# Patient Record
Sex: Male | Born: 1980 | Race: White | Hispanic: No | Marital: Married | Smoking: Former smoker
Health system: Southern US, Community
[De-identification: ages and names within clinical notes are randomized; demographics above are authoritative.]

## PROBLEM LIST (undated history)

## (undated) DIAGNOSIS — K219 Gastro-esophageal reflux disease without esophagitis: Secondary | ICD-10-CM

## (undated) DIAGNOSIS — I480 Paroxysmal atrial fibrillation: Secondary | ICD-10-CM

## (undated) DIAGNOSIS — K5900 Constipation, unspecified: Secondary | ICD-10-CM

## (undated) DIAGNOSIS — N200 Calculus of kidney: Secondary | ICD-10-CM

## (undated) DIAGNOSIS — E663 Overweight: Secondary | ICD-10-CM

## (undated) DIAGNOSIS — R5383 Other fatigue: Secondary | ICD-10-CM

## (undated) DIAGNOSIS — R0789 Other chest pain: Secondary | ICD-10-CM

## (undated) HISTORY — DX: Other fatigue: R53.83

## (undated) HISTORY — DX: Gastro-esophageal reflux disease without esophagitis: K21.9

## (undated) HISTORY — DX: Constipation, unspecified: K59.00

## (undated) HISTORY — DX: Overweight: E66.3

## (undated) HISTORY — DX: Calculus of kidney: N20.0

## (undated) HISTORY — DX: Other chest pain: R07.89

## (undated) HISTORY — PX: ELBOW SURGERY: SHX618

---
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
## 2001-04-03 ENCOUNTER — Encounter: Payer: Self-pay | Admitting: Emergency Medicine

## 2001-04-03 ENCOUNTER — Emergency Department (HOSPITAL_COMMUNITY): Admission: EM | Admit: 2001-04-03 | Discharge: 2001-04-03 | Payer: Self-pay

## 2006-01-07 ENCOUNTER — Inpatient Hospital Stay (HOSPITAL_COMMUNITY): Admission: EM | Admit: 2006-01-07 | Discharge: 2006-01-08 | Payer: Self-pay | Admitting: Emergency Medicine

## 2006-01-10 ENCOUNTER — Encounter: Admission: RE | Admit: 2006-01-10 | Discharge: 2006-04-10 | Payer: Self-pay | Admitting: Orthopedic Surgery

## 2010-08-31 ENCOUNTER — Emergency Department (HOSPITAL_COMMUNITY)
Admission: EM | Admit: 2010-08-31 | Discharge: 2010-08-31 | Disposition: A | Discharge status: Home / Self Care | Payer: 59 | Attending: Emergency Medicine | Admitting: Emergency Medicine

## 2010-08-31 ENCOUNTER — Emergency Department (HOSPITAL_COMMUNITY): Payer: 59

## 2010-08-31 ENCOUNTER — Encounter: Payer: Self-pay | Admitting: Cardiovascular Disease

## 2010-08-31 DIAGNOSIS — I4891 Unspecified atrial fibrillation: Secondary | ICD-10-CM

## 2010-08-31 DIAGNOSIS — R000 Tachycardia, unspecified: Secondary | ICD-10-CM | POA: Insufficient documentation

## 2010-08-31 DIAGNOSIS — R002 Palpitations: Secondary | ICD-10-CM | POA: Insufficient documentation

## 2010-08-31 DIAGNOSIS — R42 Dizziness and giddiness: Secondary | ICD-10-CM | POA: Insufficient documentation

## 2010-08-31 LAB — T3, FREE: T3, Free: 3.6 pg/mL (ref 2.3–4.2)

## 2010-08-31 LAB — BASIC METABOLIC PANEL
BUN: 11 mg/dL (ref 6–23)
Calcium: 9.3 mg/dL (ref 8.4–10.5)
Creatinine, Ser: 1.03 mg/dL (ref 0.4–1.5)
GFR calc non Af Amer: >60 mL/min (ref >60)
Glucose, Bld: 98 mg/dL (ref 70–99)

## 2010-08-31 LAB — CBC
HCT: 45.3 % (ref 39.0–52.0)
Hemoglobin: 16.4 g/dL (ref 13.0–17.0)
MCH: 30.7 pg (ref 26.0–34.0)
MCHC: 36.2 g/dL — ABNORMAL HIGH (ref 30.0–36.0)
MCV: 84.7 fL (ref 78.0–100.0)
Platelets: 333 10*3/uL (ref 150–400)
RBC: 5.35 MIL/uL (ref 4.22–5.81)
RDW: 11.7 % (ref 11.5–15.5)
WBC: 10.4 10*3/uL (ref 4.0–10.5)

## 2010-08-31 LAB — POCT CARDIAC MARKERS
CKMB, poc: <1 ng/mL — ABNORMAL LOW (ref 1.0–8.0)
Myoglobin, poc: 54.8 ng/mL (ref 12–200)
Troponin i, poc: <0.05 ng/mL (ref 0.00–0.09)

## 2010-08-31 LAB — RAPID URINE DRUG SCREEN, HOSP PERFORMED: Barbiturates: NOT DETECTED

## 2010-08-31 LAB — T4, FREE: Free T4: 1.15 ng/dL (ref 0.80–1.80)

## 2010-08-31 LAB — TSH: TSH: 1.456 u[IU]/mL (ref 0.350–4.500)

## 2010-09-08 ENCOUNTER — Encounter: Payer: Self-pay | Admitting: Physician Assistant

## 2010-09-14 ENCOUNTER — Encounter: Payer: Self-pay | Admitting: Physician Assistant

## 2010-09-14 ENCOUNTER — Ambulatory Visit (INDEPENDENT_AMBULATORY_CARE_PROVIDER_SITE_OTHER): Payer: 59 | Admitting: Physician Assistant

## 2010-09-14 VITALS — BP 128/78 | HR 62 | Resp 18 | Ht 74.0 in | Wt 246.0 lb

## 2010-09-14 DIAGNOSIS — I4891 Unspecified atrial fibrillation: Secondary | ICD-10-CM

## 2010-09-14 NOTE — Assessment & Plan Note (Signed)
Singular episode of parox afib.  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  He is refraining from ETOH.  TSH was ok in ED.  Echo was normal.  CHADS2=0.  Recommend he follow up in 3 months with Dr. Excell Seltzer.  If recurrent palps, will put him on a monitor.  If he has recurrent symptoms of AFib like 2 weeks ago, he should go to ED.  May be a candidate for "pill in pocket" if he has rare occurrences.  May need antiarrhythmic therapy if he has frequent recurrences.  Answered all of his questions today.  He may resume normal activity.  Do not think he needs daily ASA for now.

## 2010-09-14 NOTE — Patient Instructions (Signed)
Schedule follow up with Dr. Tonny Bollman in 3 months for atrial fibrillation.

## 2010-09-14 NOTE — Progress Notes (Signed)
History of Present Illness: Primary Cardiologist: Dr. Tonny Bollman  Jason Walls is a 30 y.o. male Who presented to the emergency room 5/18 tachypalpitations.  He was in atrial fibrillation with rapid ventricular rate.  Quick look echocardiogram demonstrated normal LV function.  He was treated with flecainide 300 mg p.o. X1.  In review of his chart, it appears that he return to normal sinus rhythm and was discharged from the emergency room.  He returns today for followup.  Labs 09/01/10: K 4.5, Creat 1.03, Hgb 16.4, TSH 1.456, CXR without acute disease; UDS: + THC  Doing well since seen in ED.  No chest pain, dyspnea, orthopnea, PND, edema or syncope.  Has occasional heart skipping but no rapid palps like when he went to ED.  No alcohol since seen in ED.  No OTC cold meds or heavy caffeine.  Past Medical History  Diagnosis Date  . Atrial fibrillation     Paroxysmal; treated with flecainide in the ED 09/01/10; echo 5/oh: EF 65%    Current Outpatient Prescriptions  Medication Sig Dispense Refill  . DISCONTD: diltiazem (CARDIZEM) 120 MG tablet          Allergies: Allergies  Allergen Reactions  . Penicillins    History  Substance Use Topics  . Smoking status: Former Games developer  . Smokeless tobacco: Not on file   Comment: 10 packs a year, quit 2 years ago  . Alcohol Use: No    Vital Signs: BP 128/78  Pulse 62  Resp 18  Ht 6\' 2"  (1.88 m)  Wt 246 lb (111.585 kg)  BMI 31.58 kg/m2  PHYSICAL EXAM: Well nourished, well developed, in no acute distress HEENT: normal Neck: no JVD Vascular: no carotid bruits Cardiac:  normal S1, S2; RRR; no murmur Lungs:  clear to auscultation bilaterally, no wheezing, rhonchi or rales Abd: soft, nontender, no hepatomegaly Ext: no edema Skin: warm and dry Neuro:  CNs 2-12 intact, no focal abnormalities noted  EKG:  Normal sinus rhythm, heart rate 62, normal axis, J-point elevation, and nonspecific ST-T wave changes  ASSESSMENT AND PLAN:
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
## 2011-01-09 ENCOUNTER — Ambulatory Visit (INDEPENDENT_AMBULATORY_CARE_PROVIDER_SITE_OTHER): Payer: 59 | Admitting: Cardiovascular Disease

## 2011-01-09 ENCOUNTER — Encounter: Payer: Self-pay | Admitting: Cardiovascular Disease

## 2011-01-09 VITALS — BP 123/66 | HR 65 | Ht 74.0 in | Wt 231.0 lb

## 2011-01-09 DIAGNOSIS — I4891 Unspecified atrial fibrillation: Secondary | ICD-10-CM

## 2011-01-09 NOTE — Progress Notes (Signed)
HPI:  This is a 30 year old gentleman presenting for followup evaluation. The patient has paroxysmal atrial fibrillation and had an isolated episode in May 2012 after alcohol consumption. He was evaluated in the emergency department and found to have normal left ventricular function. His thyroid studies were normal. He was treated with a single dose of flecainide and he converted to sinus rhythm. He did not require admission to the hospital. He presents today for followup.  Last week the patient had an episode of tachycardia palpitations that occurred after he had a few alcoholic beverages. He went to sit down in a chair and rested for about 30 minutes and his symptoms resolved. He has occasional applications at night but has not had any episodes similar to those of his initial presentation.  He denies chest pain or dyspnea. He does experience lightheadedness associated with the palpitations.  No outpatient encounter prescriptions on file as of 01/09/2011.    Allergies  Allergen Reactions  . Penicillins     Past Medical History  Diagnosis Date  . Atrial fibrillation     Paroxysmal; treated with flecainide in the ED 09/01/10; echo 5/oh: EF 65%    ROS: Negative except as per HPI  BP 123/66  Pulse 65  Ht 6\' 2"  (1.88 m)  Wt 231 lb (104.781 kg)  BMI 29.66 kg/m2  PHYSICAL EXAM: Pt is alert and oriented, NAD HEENT: normal Neck: JVP - normal, carotids 2+= without bruits Lungs: CTA bilaterally CV: RRR without murmur or gallop Abd: soft, NT, Positive BS, no hepatomegaly Ext: no C/C/E, distal pulses intact and equal Skin: warm/dry no rash  EKG:  Normal sinus rhythm with sinus arrhythmia heart rate 65 beats per minute, within normal limits.  ASSESSMENT AND PLAN:

## 2011-01-09 NOTE — Patient Instructions (Signed)
Your physician wants you to follow-up in: 1 YEAR You will receive a reminder letter in the mail two months in advance. If you don't receive a letter, please call our office to schedule the follow-up appointment.  

## 2011-01-09 NOTE — Assessment & Plan Note (Signed)
The patient remains in sinus rhythm. He does not require maintenance medication at this point. He understands that abstinence from alcohol is the best preventative measure with respect to recurrent atrial fib in his situation. I would like to see him back in one year in followup. He will call if he has problems in the interim.

## 2011-09-28 ENCOUNTER — Other Ambulatory Visit: Payer: Self-pay

## 2011-09-28 ENCOUNTER — Encounter (HOSPITAL_COMMUNITY): Payer: Self-pay | Admitting: Certified Registered"

## 2011-09-28 ENCOUNTER — Emergency Department (HOSPITAL_COMMUNITY)
Admission: EM | Admit: 2011-09-28 | Discharge: 2011-09-28 | Disposition: A | Discharge status: Home / Self Care | Payer: 59 | Attending: Emergency Medicine | Admitting: Emergency Medicine

## 2011-09-28 ENCOUNTER — Encounter (HOSPITAL_COMMUNITY): Payer: Self-pay | Admitting: Emergency Medicine

## 2011-09-28 DIAGNOSIS — Z7982 Long term (current) use of aspirin: Secondary | ICD-10-CM | POA: Insufficient documentation

## 2011-09-28 DIAGNOSIS — R002 Palpitations: Secondary | ICD-10-CM | POA: Insufficient documentation

## 2011-09-28 DIAGNOSIS — I4891 Unspecified atrial fibrillation: Secondary | ICD-10-CM

## 2011-09-28 DIAGNOSIS — I480 Paroxysmal atrial fibrillation: Secondary | ICD-10-CM | POA: Diagnosis present

## 2011-09-28 HISTORY — DX: Paroxysmal atrial fibrillation: I48.0

## 2011-09-28 LAB — POCT I-STAT, CHEM 8
Chloride: 106 mEq/L (ref 96–112)
HCT: 50 % (ref 39.0–52.0)
Hemoglobin: 17 g/dL (ref 13.0–17.0)
Potassium: 4.3 mEq/L (ref 3.5–5.1)

## 2011-09-28 MED ORDER — SODIUM CHLORIDE 0.9 % IV BOLUS (SEPSIS)
500.0000 mL | Freq: Once | INTRAVENOUS | Status: AC
  Administered 2011-09-28: 500 mL via INTRAVENOUS

## 2011-09-28 MED ORDER — SODIUM CHLORIDE 0.9 % IV SOLN: INTRAVENOUS | Status: DC

## 2011-09-28 MED ORDER — FLECAINIDE ACETATE 100 MG PO TABS
300.0000 mg | ORAL_TABLET | ORAL | Status: AC
  Administered 2011-09-28: 300 mg via ORAL
  Filled 2011-09-28: qty 3

## 2011-09-28 MED ORDER — DILTIAZEM HCL 100 MG IV SOLR
10.0000 mg/h | Freq: Once | INTRAVENOUS | Status: AC
  Administered 2011-09-28: 10 mg/h via INTRAVENOUS
  Filled 2011-09-28: qty 100

## 2011-09-28 MED ORDER — FLECAINIDE ACETATE 150 MG PO TABS: 300.0000 mg | ORAL_TABLET | Freq: Every day | ORAL | Status: DC | PRN

## 2011-09-28 NOTE — ED Notes (Signed)
Pt presents with onset of palpitations that began last night at 2200 while having sexual intercourse with his wife.  Pt reports taking a shower and was able to get to sleep, but awoke with palpitations again that have been intermittent today.  Pt reports he has seen cardiology for same, was diagnosed with afib.  Pt reports he reduced alcoho intake, but continues to have symptoms.  Pt admits to marijuana use, denies any other illicit drug use.  Pt denies any shortness of breath, or chest discomfort.  Pt reports concern over cost of bill, reports "I would have gone to my cardiologist but I knew he'd send me here anyway".

## 2011-09-28 NOTE — ED Notes (Signed)
Cardiology P.A. At bedside for consult.

## 2011-09-28 NOTE — Anesthesia Preprocedure Evaluation (Signed)
Anesthesia Evaluation    Airway       Dental   Pulmonary          Cardiovascular + dysrhythmias Atrial Fibrillation     Neuro/Psych    GI/Hepatic (+)     substance abuse  marijuana use,   Endo/Other    Renal/GU      Musculoskeletal   Abdominal   Peds  Hematology   Anesthesia Other Findings   Reproductive/Obstetrics                           Anesthesia Physical Anesthesia Plan Anesthesia Quick Evaluation

## 2011-09-28 NOTE — Progress Notes (Signed)
Patient seen and examined and history reviewed. Agree with above findings and plan.  Theron Arista [facility] 09/28/2011 2:53 PM

## 2011-09-28 NOTE — Discharge Instructions (Signed)
Take medication as prescribed by your cardiologist, and follow up as discussed with them. Return to ER if worse, persistent fast heart beat, chest pain, trouble breathing, fainting episodes, other concern.       Atrial Fibrillation Your caregiver has diagnosed you with atrial fibrillation (AFib). The heart normally beats very regularly; AFib is a type of irregular heartbeat. The heart rate may be faster or slower than normal. This can prevent your heart from pumping as well as it should. AFib can be constant (chronic) or intermittent (paroxysmal). CAUSES  Atrial fibrillation may be caused by:  Heart disease, including heart attack, coronary artery disease, heart failure, diseases of the heart valves, and others.   Blood clot in the lungs (pulmonary embolism).   Pneumonia or other infections.   Chronic lung disease.   Thyroid disease.   Toxins. These include alcohol, some medications (such as decongestant medications or diet pills), and caffeine.  In some people, no cause for AFib can be found. This is referred to as Lone Atrial Fibrillation. SYMPTOMS   Palpitations or a fluttering in your chest.   A vague sense of chest discomfort.   Shortness of breath.   Sudden onset of lightheadedness or weakness.  Sometimes, the first sign of AFib can be a complication of the condition. This could be a stroke or heart failure. DIAGNOSIS  Your description of your condition may make your caregiver suspicious of atrial fibrillation. Your caregiver will examine your pulse to determine if fibrillation is present. An EKG (electrocardiogram) will confirm the diagnosis. Further testing may help determine what caused you to have atrial fibrillation. This may include chest x-ray, echocardiogram, blood tests, or CT scans. PREVENTION  If you have previously had atrial fibrillation, your caregiver may advise you to avoid substances known to cause the condition (such as stimulant medications, and possibly  caffeine or alcohol). You may be advised to use medications to prevent recurrence. Proper treatment of any underlying condition is important to help prevent recurrence. PROGNOSIS  Atrial fibrillation does tend to become a chronic condition over time. It can cause significant complications (see below). Atrial fibrillation is not usually immediately life-threatening, but it can shorten your life expectancy. This seems to be worse in women. If you have lone atrial fibrillation and are under 82 years old, the risk of complications is very low, and life expectancy is not shortened. RISKS AND COMPLICATIONS  Complications of atrial fibrillation can include stroke, chest pain, and heart failure. Your caregiver will recommend treatments for the atrial fibrillation, as well as for any underlying conditions, to help minimize risk of complications. TREATMENT  Treatment for AFib is divided into several categories:  Treatment of any underlying condition.   Converting you out of AFib into a regular (sinus) rhythm.   Controlling rapid heart rate.   Prevention of blood clots and stroke.  Medications and procedures are available to convert your atrial fibrillation to sinus rhythm. However, recent studies have shown that this may not offer you any advantage, and cardiac experts are continuing research and debate on this topic. More important is controlling your rapid heartbeat. The rapid heartbeat causes more symptoms, and places strain on your heart. Your caregiver will advise you on the use of medications that can control your heart rate. Atrial fibrillation is a strong stroke risk. You can lessen this risk by taking blood thinning medications such as Coumadin (warfarin), or sometimes aspirin. These medications need close monitoring by your caregiver. Over-medication can cause bleeding. Too little medication may  not protect against stroke. HOME CARE INSTRUCTIONS   If your caregiver prescribed medicine to make  your heartbeat more normally, take as directed.   If blood thinners were prescribed by your caregiver, take EXACTLY as directed.   Perform blood tests EXACTLY as directed.   Quit smoking. Smoking increases your cardiac and lung (pulmonary) risks.   DO NOT drink alcohol.   DO NOT drink caffeinated drinks (e.g. coffee, soda, chocolate, and leaf teas). You may drink decaffeinated coffee, soda or tea.   If you are overweight, you should choose a reduced calorie diet to lose weight. Please see a registered dietitian if you need more information about healthy weight loss. DO NOT USE DIET PILLS as they may aggravate heart problems.   If you have other heart problems that are causing AFib, you may need to eat a low salt, fat, and cholesterol diet. Your caregiver will tell you if this is necessary.   Exercise every day to improve your physical fitness. Stay active unless advised otherwise.   If your caregiver has given you a follow-up appointment, it is very important to keep that appointment. Not keeping the appointment could result in heart failure or stroke. If there is any problem keeping the appointment, you must call back to this facility for assistance.  SEEK MEDICAL CARE IF:  You notice a change in the rate, rhythm or strength of your heartbeat.   You develop an infection or any other change in your overall health status.  SEEK IMMEDIATE MEDICAL CARE IF:   You develop chest pain, abdominal pain, sweating, weakness or feel sick to your stomach (nausea).   You develop shortness of breath.   You develop swollen feet and ankles.   You develop dizziness, numbness, or weakness of your face or limbs, or any change in vision or speech.  MAKE SURE YOU:   Understand these instructions.   Will watch your condition.   Will get help right away if you are not doing well or get worse.  Document Released: 04/02/2005 Document Revised: 03/22/2011 Document Reviewed: 11/05/2007 [facility] Patient  Information 2012 [place], [place].

## 2011-09-28 NOTE — ED Notes (Signed)
Continue to monitor, pt's cardiac rhythm remains irregular, noted a-fib.   Rate fluctuates between 80 - 110.

## 2011-09-28 NOTE — ED Notes (Addendum)
Pt sts he has Afib and felt like his heart was racing last night. Presents this morning because he still feels palpitations.  Does not currently take medications for this as his cardiologist has informed him he doesn't need it yet.  HR at NF 89, 100% RA

## 2011-09-28 NOTE — ED Notes (Signed)
Pt doing well; just waiting for cardiology to see him; wife at bedside, watching tv, holding hands; no needs at this time
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
## 2011-09-28 NOTE — ED Notes (Signed)
Pt c/o palpitations starting last night; pt sts hx of afib in past; pt denies CP or SOB

## 2011-09-28 NOTE — ED Notes (Signed)
Dr. Eden Emms at bedside, explaining cardioversion procedure to pt with pt converting to NSR.  EKG repeated.
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
## 2011-09-28 NOTE — Preoperative (Signed)
Beta Blockers   Reason not to administer Beta Blockers:Not Applicable 

## 2011-09-28 NOTE — Progress Notes (Signed)
Pt had not yet converted after Flecainide challenge so set up DCCV. When PN went in to see pt, he converted to SR. No CP/SOB and feeling well. Per PJ, OK to d/c with "pill in the pocket" Flecainide and f/u in office.

## 2011-09-29 NOTE — Transfer of Care (Signed)
Immediate Anesthesia Transfer of Care Note  Patient: Jason Walls  Procedure(s) Performed: * No procedures listed *   Case canceled.

## 2011-09-29 NOTE — Anesthesia Postprocedure Evaluation (Signed)
  Anesthesia Post-op Note  Patient: Jason Walls   Case canceled.

## 2011-12-06 ENCOUNTER — Encounter: Payer: 59 | Admitting: Cardiovascular Disease

## 2012-09-23 ENCOUNTER — Telehealth: Payer: Self-pay | Admitting: Cardiovascular Disease

## 2012-09-23 ENCOUNTER — Other Ambulatory Visit: Payer: Self-pay | Admitting: *Deleted

## 2012-09-23 NOTE — Telephone Encounter (Signed)
I DONT SEE THIS ON PT REGULAR MED LIST. THERE IS A NOTE FOR HIS CARDIOLOGIST IN EPIC FOR TODAY STATING HE THOUGHT HE HAD AFIB LAST NIGHT. JUST WANTED YOU TO BE AWARE OF THIS.  LAST OV 10/13. LAST RF 09/11/12.

## 2012-09-23 NOTE — Telephone Encounter (Signed)
New Problem  Pt states he feels like he had an afib last night. He said that he did not want to go the ED if he does not need to. He said he took his medication and is feeling better today, he just wants to make sure he does not need to be seen.

## 2012-09-23 NOTE — Telephone Encounter (Signed)
Last seen in the office 01/09/11 by Dr Excell Seltzer, the pt was seen in the ER 09/28/11 for AFib also.  I spoke with the pt and he states that he felt like he went into AFib last night. At his previous ER visit he was given a Rx for Flecainide 300mg  "pill in the pocket" to use as needed for AFib. The pt did take a dose of Flecainide and felt like he went back into NSR. The pt would like to make sure that he is back in NSR.  His pulse at this time is running in the 60-70s. I made the pt aware that he is over due to see Dr Excell Seltzer.  I have scheduled him for an appointment tomorrow at 11:15.  I advised the pt that if he would like to come into the office today just for an EKG to be performed then that can be scheduled.  At this time the pt feels like he is in NSR and would like to wait until appointment tomorrow to have EKG.

## 2012-09-24 ENCOUNTER — Ambulatory Visit (INDEPENDENT_AMBULATORY_CARE_PROVIDER_SITE_OTHER): Payer: 59 | Admitting: Cardiovascular Disease

## 2012-09-24 ENCOUNTER — Encounter: Payer: Self-pay | Admitting: Cardiovascular Disease

## 2012-09-24 VITALS — BP 130/88 | HR 70 | Ht 73.0 in | Wt 261.0 lb

## 2012-09-24 DIAGNOSIS — I480 Paroxysmal atrial fibrillation: Secondary | ICD-10-CM

## 2012-09-24 DIAGNOSIS — I4891 Unspecified atrial fibrillation: Secondary | ICD-10-CM

## 2012-09-24 NOTE — Progress Notes (Signed)
   HPI:  32 year old gentleman presenting for followup evaluation. The patient is followed for paroxysmal atrial fibrillation. He has had rare episodes of rapid atrial fibrillation. He initially presented in 2012 and converted in the emergency department with IV diltiazem and an oral dose of flecainide. An echocardiogram at that time showed normal left ventricular systolic function with an ejection fraction of 65% and no valvular disease. At that point he was noted to periodically binge drinking he discontinue alcohol completely. He had another episode of atrial fibrillation in 2013, again converting with oral flecainide. He did well until last week when he had another recurrence of tachypalpitations. This time he had flecainide on hand at home and he took 300 mg, converting within an hour. He did not have associated chest pain, shortness of breath, chest pressure, or syncope. He did have some lightheadedness and felt uncomfortable with his heart pounding. He notes that he smokes marijuana regularly. He does not drink alcohol anymore. He is physically active without exertional symptoms.  Outpatient Encounter Prescriptions as of 09/24/2012  Medication Sig Dispense Refill  . flecainide (TAMBOCOR) 150 MG tablet Take 2 tablets (300 mg total) by mouth daily as needed.  2 tablet  6  . tamsulosin (FLOMAX) 0.4 MG CAPS       . Hydrocodone-Acetaminophen 5-300 MG TABS       . [DISCONTINUED] aspirin 325 MG tablet Take 650 mg by mouth once.      . [DISCONTINUED] dexamethasone (DECADRON) 4 MG tablet       . [DISCONTINUED] methocarbamol (ROBAXIN) 750 MG tablet        No facility-administered encounter medications on file as of 09/24/2012.    Allergies  Allergen Reactions  . Penicillins Other (See Comments)    Unknown-childhood reaction    Past Medical History  Diagnosis Date  . Paroxysmal atrial fibrillation     Paroxysmal; treated with flecainide in the ED 09/01/10; echo 08/31/2010: EF 65%    ROS: Negative  except as per HPI  BP 130/88  Pulse 70  Ht 6\' 1"  (1.854 m)  Wt 261 lb (118.389 kg)  BMI 34.44 kg/m2  PHYSICAL EXAM: Pt is alert and oriented, NAD HEENT: normal Neck: JVP - normal, carotids 2+= without bruits Lungs: CTA bilaterally CV: RRR without murmur or gallop Abd: soft, NT, Positive BS, no hepatomegaly Ext: no C/C/E, distal pulses intact and equal Skin: warm/dry no rash  EKG:  Normal sinus rhythm 70 beats per minute, within normal limits.  ASSESSMENT AND PLAN: Paroxysmal atrial fibrillation. He has had episodes about once per year. These responded well to a "pill in the pocket" approach with flecainide. His CHADS-Vasc score is 0, so anitcoagulation is not indicated. He was counseled about marijuana cessation. He is motivated to do everything he can to stop his atrial fibrillation. I think it makes sense to get him plugged in with Dr. Johney Frame, as he is young and likely will have further problems with atrial fibrillation. We'll schedule him for a consultation.  Tonny Bollman 09/24/2012 /12:05 PM

## 2012-09-24 NOTE — Patient Instructions (Addendum)
You have been referred to Dr Johney Frame for discussion of treatment of Atrial Fibrillation.   Your physician recommends that you continue on your current medications as directed. Please refer to the Current Medication list given to you today.

## 2012-09-26 ENCOUNTER — Telehealth: Payer: Self-pay | Admitting: Cardiovascular Disease

## 2012-09-26 NOTE — Telephone Encounter (Signed)
**Note De-identified Jason Walls Obfuscation** LMTCB

## 2012-09-26 NOTE — Telephone Encounter (Signed)
Follow-up: ° ° ° °Patient called in returning your call.  Please call back. °

## 2012-09-26 NOTE — Telephone Encounter (Signed)
New problem  Pt is wondering if carbon monoxide poisoning can cause atrial fibrillation.  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

## 2012-09-29 NOTE — Telephone Encounter (Signed)
I left a message for the patient to call. 

## 2012-09-29 NOTE — Telephone Encounter (Signed)
I spoke with the patient and made him aware that I was not completely certain that carbon monoxide is causing his a-fib. 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 I advised that if levels are high enough maybe this could cause enough stress on his body to trigger a-fib. He states this started 3 years ago when he started working at his current job. I have advised he keep follow up with Dr. Johney Frame on 7/3 to discuss further. He is agreeable.

## 2012-10-16 ENCOUNTER — Encounter: Payer: Self-pay | Admitting: Internal Medicine

## 2012-10-16 ENCOUNTER — Ambulatory Visit (INDEPENDENT_AMBULATORY_CARE_PROVIDER_SITE_OTHER): Payer: 59 | Admitting: Internal Medicine

## 2012-10-16 VITALS — BP 112/68 | HR 76 | Ht 73.0 in | Wt 263.6 lb

## 2012-10-16 DIAGNOSIS — I4891 Unspecified atrial fibrillation: Secondary | ICD-10-CM

## 2012-10-16 DIAGNOSIS — R0683 Snoring: Secondary | ICD-10-CM | POA: Insufficient documentation

## 2012-10-16 DIAGNOSIS — E669 Obesity, unspecified: Secondary | ICD-10-CM | POA: Insufficient documentation

## 2012-10-16 DIAGNOSIS — I480 Paroxysmal atrial fibrillation: Secondary | ICD-10-CM

## 2012-10-16 NOTE — Patient Instructions (Addendum)
Your physician wants you to follow-up in:  in 12 months with Dr Theodoro Parma will receive a reminder letter in the mail two months in advance. If you don't receive a letter, please call our office to schedule the follow-up appointment.   Your physician recommends that you schedule a follow-up appointment as needed with Dr Johney Frame  Your physician has recommended that you have a sleep study. This test records several body functions during sleep, including: brain activity, eye movement, oxygen and carbon dioxide blood levels, heart rate and rhythm, breathing rate and rhythm, the flow of air through your mouth and nose, snoring, body muscle movements, and chest and belly movement.
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
## 2012-11-11 ENCOUNTER — Encounter (HOSPITAL_BASED_OUTPATIENT_CLINIC_OR_DEPARTMENT_OTHER): Payer: 59

## 2013-04-30 ENCOUNTER — Other Ambulatory Visit: Payer: Self-pay | Admitting: *Deleted

## 2013-04-30 MED ORDER — FLECAINIDE ACETATE 150 MG PO TABS: 300.0000 mg | ORAL_TABLET | Freq: Every day | ORAL | Status: DC | PRN

## 2013-11-12 ENCOUNTER — Encounter: Payer: Self-pay | Admitting: Cardiovascular Disease

## 2013-11-12 ENCOUNTER — Ambulatory Visit (INDEPENDENT_AMBULATORY_CARE_PROVIDER_SITE_OTHER): Payer: 59 | Admitting: Cardiovascular Disease

## 2013-11-12 VITALS — BP 106/80 | HR 61 | Ht 73.0 in | Wt 252.8 lb

## 2013-11-12 DIAGNOSIS — I480 Paroxysmal atrial fibrillation: Secondary | ICD-10-CM

## 2013-11-12 DIAGNOSIS — I4891 Unspecified atrial fibrillation: Secondary | ICD-10-CM

## 2013-11-12 NOTE — Patient Instructions (Signed)
Your physician recommends that you continue on your current medications as directed. Please refer to the Current Medication list given to you today.  Your physician wants you to follow-up in: 1 year with Dr. Cooper.  You will receive a reminder letter in the mail two months in advance. If you don't receive a letter, please call our office to schedule the follow-up appointment.   

## 2013-11-12 NOTE — Progress Notes (Signed)
    HPI:  33 year old gentleman presenting for followup evaluation. The patient is followed for paroxysmal atrial fibrillation. He has had rare episodes of rapid atrial fibrillation. He initially presented in 2012 and converted in the emergency department with IV diltiazem and an oral dose of flecainide. An echocardiogram at that time showed normal left ventricular systolic function with an ejection fraction of 65% and no valvular disease. At that point he was noted to periodically binge drink but he no longer drinks alcohol in excess. He had another episode of atrial fibrillation in 2013, again converting with oral flecainide.   The patient has done well since I saw him last year. He's had no episodes of heart palpitations, chest pain, or shortness of breath. He exercises occasionally without exertional symptoms. He has no complaints today.   Outpatient Encounter Prescriptions as of 11/12/2013  Medication Sig  . ciprofloxacin (CIPRO) 500 MG tablet   . flecainide (TAMBOCOR) 150 MG tablet Take 2 tablets (300 mg total) by mouth daily as needed.  . Hydrocodone-Acetaminophen 5-300 MG TABS   . oxyCODONE-acetaminophen (PERCOCET/ROXICET) 5-325 MG per tablet   . [DISCONTINUED] tamsulosin (FLOMAX) 0.4 MG CAPS Take 0.4 mg by mouth daily.     Allergies  Allergen Reactions  . Penicillins Other (See Comments)    Unknown-childhood reaction    Past Medical History  Diagnosis Date  . Paroxysmal atrial fibrillation     Paroxysmal; treated with flecainide in the ED 09/01/10; echo 08/31/2010: EF 65%  . Overweight     BP 106/80  Pulse 61  Ht 6\' 1"  (1.854 m)  Wt 252 lb 12.8 oz (114.669 kg)  BMI 33.36 kg/m2  PHYSICAL EXAM: Pt is alert and oriented, NAD HEENT: normal Neck: JVP - normal, carotids 2+= without bruits Lungs: CTA bilaterally CV: RRR without murmur or gallop Abd: soft, NT, Positive BS, no hepatomegaly Ext: no C/C/E, distal pulses intact and equal Skin: warm/dry no rash  EKG:  Normal  sinus rhythm 61 beats per minute, within normal limits.  ASSESSMENT AND PLAN: Paroxysmal atrial fibrillation. The patient is stable with no episodes of symptomatic atrial fibrillation over the past one year. CHADS-Vasc = 0. He will continue to use flecainide if necessary for a pill in the pocket approach to his rare episodes of atrial fibrillation. I will see him back in one year for followup evaluation. He understands that weight loss is going to be an important part of controlling his atrial fibrillation in the future. We reviewed the importance of diet and exercise.  Tonny BollmanMichael Dwayne Bulkley 11/12/2013 8:21 AM

## 2014-05-10 ENCOUNTER — Telehealth: Payer: Self-pay | Admitting: Family Medicine

## 2014-05-10 ENCOUNTER — Ambulatory Visit (INDEPENDENT_AMBULATORY_CARE_PROVIDER_SITE_OTHER): Payer: 59 | Admitting: Family Medicine

## 2014-05-10 ENCOUNTER — Encounter: Payer: Self-pay | Admitting: Family Medicine

## 2014-05-10 ENCOUNTER — Ambulatory Visit (INDEPENDENT_AMBULATORY_CARE_PROVIDER_SITE_OTHER): Payer: 59

## 2014-05-10 VITALS — BP 132/82 | HR 87 | Temp 98.9°F | Ht 73.0 in | Wt 267.0 lb

## 2014-05-10 DIAGNOSIS — M545 Low back pain: Secondary | ICD-10-CM

## 2014-05-10 DIAGNOSIS — R350 Frequency of micturition: Secondary | ICD-10-CM

## 2014-05-10 LAB — POCT URINALYSIS DIPSTICK
Bilirubin, UA: NEGATIVE
Glucose, UA: NEGATIVE
Ketones, UA: NEGATIVE
LEUKOCYTES UA: NEGATIVE
Nitrite, UA: NEGATIVE
PROTEIN UA: NEGATIVE
Spec Grav, UA: <=1.005
Urobilinogen, UA: NEGATIVE
pH, UA: 6.5

## 2014-05-10 LAB — POCT UA - MICROSCOPIC ONLY
Bacteria, U Microscopic: NEGATIVE
Casts, Ur, LPF, POC: NEGATIVE
Crystals, Ur, HPF, POC: NEGATIVE
Mucus, UA: NEGATIVE
WBC, UR, HPF, POC: NEGATIVE
Yeast, UA: NEGATIVE

## 2014-05-10 MED ORDER — TRAMADOL HCL 50 MG PO TABS: 50.0000 mg | ORAL_TABLET | ORAL | Status: DC | PRN

## 2014-05-10 NOTE — Telephone Encounter (Signed)
appt given for today 

## 2014-05-10 NOTE — Patient Instructions (Signed)
Constipation Constipation is when a person has fewer than three bowel movements a week, has difficulty having a bowel movement, or has stools that are dry, hard, or larger than normal. As people grow older, constipation is more common. If you try to fix constipation with medicines that make you have a bowel movement (laxatives), the problem may get worse. Long-term laxative use may cause the muscles of the colon to become weak. A low-fiber diet, not taking in enough fluids, and taking certain medicines may make constipation worse.  CAUSES   Certain medicines, such as antidepressants, pain medicine, iron supplements, antacids, and water pills.   Certain diseases, such as diabetes, irritable bowel syndrome (IBS), thyroid disease, or depression.   Not drinking enough water.   Not eating enough fiber-rich foods.   Stress or travel.   Lack of physical activity or exercise.   Ignoring the urge to have a bowel movement.   Using laxatives too much.  SIGNS AND SYMPTOMS   Having fewer than three bowel movements a week.   Straining to have a bowel movement.   Having stools that are hard, dry, or larger than normal.   Feeling full or bloated.   Pain in the lower abdomen.   Not feeling relief after having a bowel movement.  DIAGNOSIS  Your health care provider will take a medical history and perform a physical exam. Further testing may be done for severe constipation. Some tests may include:  A barium enema X-ray to examine your rectum, colon, and, sometimes, your small intestine.   A sigmoidoscopy to examine your lower colon.   A colonoscopy to examine your entire colon. TREATMENT  Treatment will depend on the severity of your constipation and what is causing it. Some dietary treatments include drinking more fluids and eating more fiber-rich foods. Lifestyle treatments may include regular exercise. If these diet and lifestyle recommendations do not help, your health care  provider may recommend taking over-the-counter laxative medicines to help you have bowel movements. Prescription medicines may be prescribed if over-the-counter medicines do not work.  HOME CARE INSTRUCTIONS   Eat foods that have a lot of fiber, such as fruits, vegetables, whole grains, and beans.  Limit foods high in fat and processed sugars, such as french fries, hamburgers, cookies, candies, and soda.   A fiber supplement may be added to your diet if you cannot get enough fiber from foods.   Drink enough fluids to keep your urine clear or pale yellow.   Exercise regularly or as directed by your health care provider.   Go to the restroom when you have the urge to go. Do not hold it.   Only take over-the-counter or prescription medicines as directed by your health care provider. Do not take other medicines for constipation without talking to your health care provider first.   Obtain a bottle of Magnesium Citrate and drink the entire bottle. Also can use miralax if extra support is needed SEEK IMMEDIATE MEDICAL CARE IF:   You have bright red blood in your stool.   Your constipation lasts for more than 4 days or gets worse.   You have abdominal or rectal pain.   You have thin, pencil-like stools.   You have unexplained weight loss. MAKE SURE YOU:   Understand these instructions.  Will watch your condition.  Will get help right away if you are not doing well or get worse. Document Released: 12/30/2003 Document Revised: 04/07/2013 Document Reviewed: 01/12/2013 [facility] Patient Information 2015 [employer] [place]. This  information is not intended to replace advice given to you by your health care provider. Make sure you discuss any questions you have with your health care provider.  

## 2014-05-10 NOTE — Progress Notes (Addendum)
Subjective:    Patient ID: Jason Walls, male    DOB: April 15, 1981, 34 y.o.   MRN: [id number]  HPI   34 year old male comes in today with complain of lower back pain on the right side for 2 days. No known injury. He does have a history of kidney stones. He is positive for urinary frequency and dark urine but denies gross hematuria. Frequency every 20 min X 2 days. Pain is 6/10. R flank. Somnetimes slightest motion exacerbates pain. Waxes and wanes. Feelslike pressure and a twisting.  Patient Active Problem List   Diagnosis Date Noted  . Obesity 10/16/2012  . Snoring 10/16/2012  . Paroxysmal atrial fibrillation    Outpatient Encounter Prescriptions as of 05/10/2014  Medication Sig  . flecainide (TAMBOCOR) 150 MG tablet Take 2 tablets (300 mg total) by mouth daily as needed. (Patient not taking: Reported on 05/10/2014)  . [DISCONTINUED] ciprofloxacin (CIPRO) 500 MG tablet   . [DISCONTINUED] Hydrocodone-Acetaminophen 5-300 MG TABS   . [DISCONTINUED] oxyCODONE-acetaminophen (PERCOCET/ROXICET) 5-325 MG per tablet      Review of Systems  Constitutional: Negative for fever, chills, diaphoresis and unexpected weight change.  HENT: Negative for congestion, hearing loss, rhinorrhea, sore throat and trouble swallowing.   Gastrointestinal: Negative for nausea, vomiting, abdominal pain, diarrhea, constipation and abdominal distention.  Endocrine: Negative for cold intolerance and heat intolerance.  Genitourinary: Positive for hematuria and flank pain. Negative for dysuria and frequency.  Musculoskeletal: Negative for joint swelling and arthralgias.  Skin: Negative for rash.  Neurological: Negative for dizziness and headaches.  Psychiatric/Behavioral: Negative for dysphoric mood, decreased concentration and agitation. The patient is not nervous/anxious.        Objective:   Physical Exam  Constitutional: He is oriented to person, place, and time. He appears well-developed and well-nourished. No  distress.  HENT:  Head: Normocephalic and atraumatic.  Right Ear: External ear normal.  Left Ear: External ear normal.  Nose: Nose normal.  Mouth/Throat: Oropharynx is clear and moist.  Eyes: Conjunctivae and EOM are normal. Pupils are equal, round, and reactive to light.  Neck: Normal range of motion. Neck supple. No thyromegaly present.  Cardiovascular: Normal rate, regular rhythm and normal heart sounds.   No murmur heard. Pulmonary/Chest: Effort normal and breath sounds normal. No respiratory distress. He has no wheezes. He has no rales.  Abdominal: Soft. Bowel sounds are normal. He exhibits no distension and no mass. There is tenderness (left flank, moderate). There is no rebound and no guarding.  Lymphadenopathy:    He has no cervical adenopathy.  Neurological: He is alert and oriented to person, place, and time. He has normal reflexes.  Skin: Skin is warm and dry.  Psychiatric: He has a normal mood and affect. His behavior is normal. Judgment and thought content normal.   BP 132/82 mmHg  Pulse 87  Temp(Src) 98.9 F (37.2 C) (Oral)  Ht 6\' 1"  (1.854 m)  Wt 267 lb (121.11 kg)  BMI 35.23 kg/m2        Assessment & Plan:   1. Low back pain without sciatica, unspecified back pain laterality     Meds ordered this encounter  Medications  . traMADol (ULTRAM) 50 MG tablet    Sig: Take 1 tablet (50 mg total) by mouth every 4 (four) hours as needed for moderate pain.    Dispense:  30 tablet    Refill:  2    Orders Placed This Encounter  Procedures  . DG Abd 1 View  Standing Status: Future     Number of Occurrences: 1     Standing Expiration Date: 07/10/2015    Order Specific Question:  Reason for Exam (SYMPTOM  OR DIAGNOSIS REQUIRED)    Answer:  low back pain    Order Specific Question:  Preferred imaging location?    Answer:  Internal  . CT RENAL STONE STUDY    Standing Status: Future     Number of Occurrences:      Standing Expiration Date: 08/09/2015    Order  Specific Question:  Reason for Exam (SYMPTOM  OR DIAGNOSIS REQUIRED)    Answer:  right flank pain    Order Specific Question:  Preferred imaging location?    Answer:  [facility]  . POCT UA - Microscopic Only  . POCT urinalysis dipstick    Labs pending Health Maintenance reviewed Diet and exercise encouraged Continue all meds as discussed Follow up in 1wk  Mechele Claude, MD

## 2014-05-11 ENCOUNTER — Telehealth: Payer: Self-pay | Admitting: Family Medicine

## 2014-05-12 ENCOUNTER — Ambulatory Visit (HOSPITAL_COMMUNITY): Payer: 59

## 2015-05-17 ENCOUNTER — Encounter: Payer: Self-pay | Admitting: Family Medicine

## 2015-05-17 ENCOUNTER — Ambulatory Visit (INDEPENDENT_AMBULATORY_CARE_PROVIDER_SITE_OTHER): Payer: 59 | Admitting: Family Medicine

## 2015-05-17 VITALS — BP 119/77 | HR 103 | Temp 97.2°F | Ht 73.0 in | Wt 279.2 lb

## 2015-05-17 DIAGNOSIS — M545 Low back pain: Secondary | ICD-10-CM

## 2015-05-17 LAB — POCT URINALYSIS DIPSTICK
Bilirubin, UA: NEGATIVE
Glucose, UA: NEGATIVE
Ketones, UA: NEGATIVE
Leukocytes, UA: NEGATIVE
Nitrite, UA: NEGATIVE
Protein, UA: NEGATIVE
Spec Grav, UA: 1.01
UROBILINOGEN UA: NEGATIVE
pH, UA: 7.5

## 2015-05-17 MED ORDER — HYDROCODONE-ACETAMINOPHEN 5-325 MG PO TABS: 1.0000 | ORAL_TABLET | Freq: Four times a day (QID) | ORAL | Status: DC | PRN

## 2015-05-17 MED ORDER — CYCLOBENZAPRINE HCL 10 MG PO TABS: 10.0000 mg | ORAL_TABLET | Freq: Three times a day (TID) | ORAL | Status: DC | PRN

## 2015-05-17 NOTE — Patient Instructions (Signed)

## 2015-05-17 NOTE — Progress Notes (Signed)
Subjective:  Patient ID: Jason Walls, male    DOB: [birth year]  Age: 35 y.o. MRN: [id number]  CC: Back Pain   HPI Jason Walls presents for back pain onset yesterday on awakening. Night before last he bent over to chain dog's collar. Stood up and felt pop. Relief with relaxation. Hurts to sit up. Slept okay last night. Turned over and hit with severe pain and had to get out of bed and sit up from 2-4 AM. Pain 8-9/10 with movement. 6-7 when resting. Pain is at left L3 region throbbing dull ache. Not sharp or cramping.   History Jason Walls has a past medical history of Paroxysmal atrial fibrillation (HCC); Overweight; and Kidney stones.   He has past surgical history that includes Elbow surgery.   His family history includes Heart attack in his other; Heart disease in his other and other. There is no history of Coronary artery disease.He reports that he has quit smoking. He does not have any smokeless tobacco history on file. He reports that he drinks alcohol. He reports that he uses illicit drugs (Marijuana).    ROS Review of Systems  Constitutional: Negative for fever, chills, diaphoresis and unexpected weight change.  HENT: Negative for congestion, hearing loss, rhinorrhea and sore throat.   Eyes: Negative for visual disturbance.  Respiratory: Negative for cough and shortness of breath.   Cardiovascular: Negative for chest pain.  Gastrointestinal: Negative for abdominal pain, diarrhea and constipation.  Genitourinary: Negative for dysuria and flank pain.  Musculoskeletal: Positive for myalgias, back pain, joint swelling and arthralgias.  Skin: Negative for rash.  Neurological: Negative for dizziness and headaches.  Psychiatric/Behavioral: Negative for sleep disturbance and dysphoric mood.    Objective:  BP 119/77 mmHg  Pulse 103  Temp(Src) 97.2 F (36.2 C) (Oral)  Ht  (1.854 m)  Wt 279 lb 3.2 oz (126.644 kg)  BMI 36.84 kg/m2  SpO2 100%  BP Readings from Last 3  Encounters:  05/17/15 119/77  05/10/14 132/82  11/12/13 106/80    Wt Readings from Last 3 Encounters:  05/17/15 279 lb 3.2 oz (126.644 kg)  05/10/14 267 lb (121.11 kg)  11/12/13 252 lb 12.8 oz (114.669 kg)     Physical Exam  Constitutional: He appears well-developed and well-nourished.  HENT:  Head: Normocephalic and atraumatic.  Right Ear: Tympanic membrane and external ear normal. No decreased hearing is noted.  Left Ear: Tympanic membrane and external ear normal. No decreased hearing is noted.  Mouth/Throat: No oropharyngeal exudate or posterior oropharyngeal erythema.  Eyes: Pupils are equal, round, and reactive to light.  Neck: Normal range of motion. Neck supple.  Cardiovascular: Normal rate and regular rhythm.   No murmur heard. Pulmonary/Chest: Breath sounds normal. No respiratory distress.  Abdominal: Soft. Bowel sounds are normal. He exhibits no mass. There is no tenderness.  Musculoskeletal:       Lumbar back: He exhibits tenderness, edema, pain and spasm.  Left sided sx in paraspinous muscles   Vitals reviewed.    Lab Results  Component Value Date   WBC 10.4 08/31/2010   HGB 17.0 09/28/2011   HCT 50.0 09/28/2011   PLT 333 08/31/2010   GLUCOSE 112* 09/28/2011   NA 141 09/28/2011   K 4.3 09/28/2011   CL 106 09/28/2011   CREATININE 1.20 09/28/2011   BUN 9 09/28/2011   CO2 25 08/31/2010   TSH 1.456 08/31/2010    No results found.  Assessment & Plan:   Jason Walls was seen today for  back pain.  Diagnoses and all orders for this visit:  Low back pain without sciatica, unspecified back pain laterality -     POCT urinalysis dipstick  Other orders -     cyclobenzaprine (FLEXERIL) 10 MG tablet; Take 1 tablet (10 mg total) by mouth 3 (three) times daily as needed for muscle spasms. -     HYDROcodone-acetaminophen (NORCO) 5-325 MG tablet; Take 1 tablet by mouth every 6 (six) hours as needed for moderate pain.   Rest st home 2 days Sedentary after. Back  exercises BID  I have discontinued Mr. Goeser's traMADol. I am also having him start on cyclobenzaprine and HYDROcodone-acetaminophen. Additionally, I am having him maintain his flecainide.  Meds ordered this encounter  Medications  . cyclobenzaprine (FLEXERIL) 10 MG tablet    Sig: Take 1 tablet (10 mg total) by mouth 3 (three) times daily as needed for muscle spasms.    Dispense:  90 tablet    Refill:  1  . HYDROcodone-acetaminophen (NORCO) 5-325 MG tablet    Sig: Take 1 tablet by mouth every 6 (six) hours as needed for moderate pain.    Dispense:  30 tablet    Refill:  0     Follow-up: Return in about 2 weeks (around 05/31/2015).  Mechele Claude, M.D.

## 2016-09-07 IMAGING — CR DG ABDOMEN 1V
1 series · 1 of 1 positions shown · non-contrast
Comparison: No priors.

CLINICAL DATA: 33-year-old male with history of kidney stones.

EXAM:
ABDOMEN - 1 VIEW

[view not recorded]
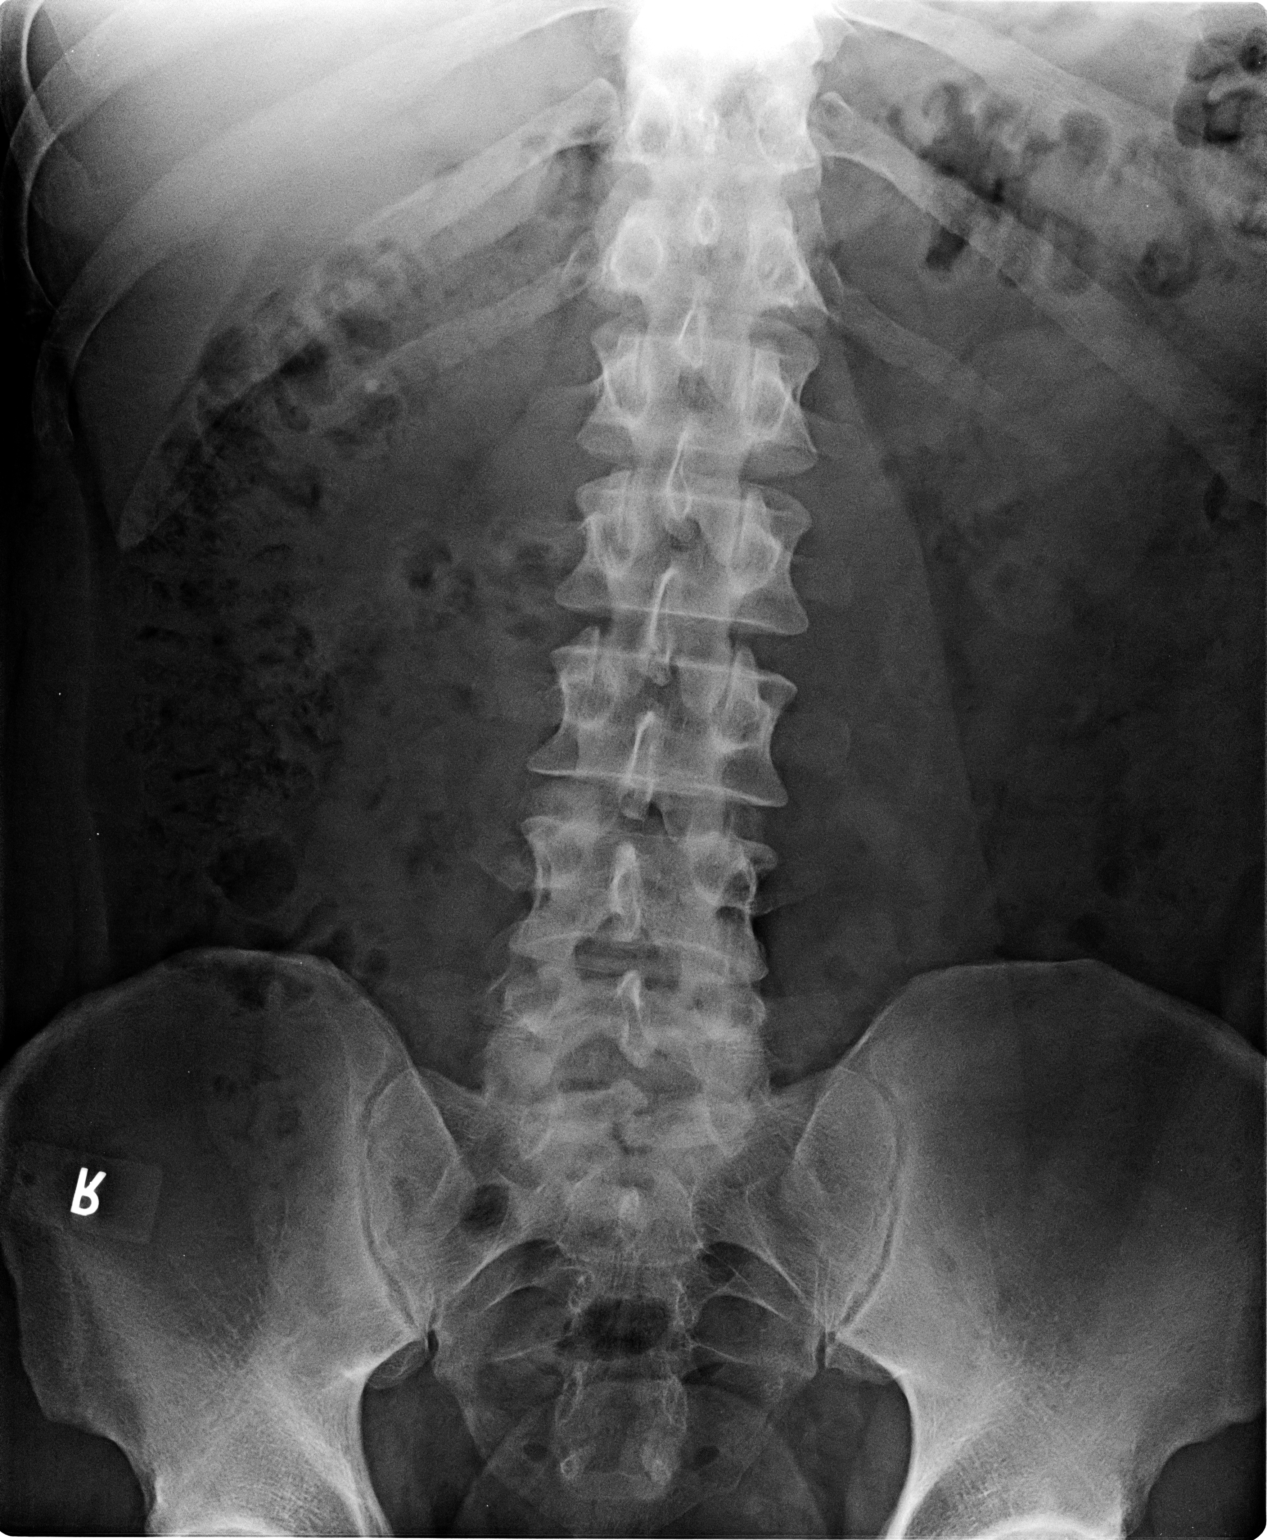

[1 of 1 positions shown; findings below may reference images not displayed]

FINDINGS: 4 mm calcification projecting over the upper pole of the right
kidney suggestive of a nonobstructive calculus. No additional
calcifications are identified overlying the left kidney, the
expected course of either ureter, or the visualized portions of the
urinary bladder. Gas and stool are seen scattered throughout the
colon extending to the level of the distal rectum. No pathologic
distension of small bowel is noted. No gross evidence of
pneumoperitoneum.
IMPRESSION: 1. 4 mm calcification projecting over the upper pole of the right
kidney likely represents a nonobstructive calculus.

## 2018-09-19 DIAGNOSIS — H5213 Myopia, bilateral: Secondary | ICD-10-CM | POA: Diagnosis not present

## 2019-02-18 ENCOUNTER — Other Ambulatory Visit: Payer: Self-pay | Admitting: *Deleted

## 2019-02-18 DIAGNOSIS — Z20822 Contact with and (suspected) exposure to covid-19: Secondary | ICD-10-CM

## 2019-02-19 LAB — NOVEL CORONAVIRUS, NAA: SARS-CoV-2, NAA: NOT DETECTED

## 2019-05-18 ENCOUNTER — Ambulatory Visit: Payer: BLUE CROSS/BLUE SHIELD | Attending: Internal Medicine

## 2019-05-18 ENCOUNTER — Other Ambulatory Visit: Payer: Self-pay

## 2019-05-18 DIAGNOSIS — Z20822 Contact with and (suspected) exposure to covid-19: Secondary | ICD-10-CM | POA: Insufficient documentation

## 2019-05-19 LAB — NOVEL CORONAVIRUS, NAA: SARS-CoV-2, NAA: NOT DETECTED

## 2019-05-28 ENCOUNTER — Telehealth: Payer: Self-pay | Admitting: Cardiovascular Disease

## 2019-05-28 NOTE — Telephone Encounter (Signed)
Left message to call back  

## 2019-05-28 NOTE — Telephone Encounter (Signed)
Patient was applying for [employer] and a flag came up during the application process, and the insurance agent wanted him to follow up with Dr. Excell Seltzer. The patient has not seen Dr. Excell Seltzer since 2015, and has not had any issues with Afib since his last office visit. The patient really just needs a clearance letter from Dr. Excell Seltzer stating his cardiac issues have resolved and he is no longer under Cardiac Care.  He will reach out to the [employer] and try to get more specific details, but in the mean time this is all he knows.

## 2019-06-01 NOTE — Telephone Encounter (Signed)
The patient reports he needs a letter from Dr. Excell Seltzer clearing him so he can get life insurance.  The patient was last seen in 2015 and was not seen for his 1 year visit. Scheduled the patient 3/12 with Dr. Excell Seltzer for check up and to discuss letter for insurance. He was grateful for assistance.

## 2019-06-11 DIAGNOSIS — I4891 Unspecified atrial fibrillation: Secondary | ICD-10-CM | POA: Diagnosis not present

## 2019-06-11 DIAGNOSIS — R0989 Other specified symptoms and signs involving the circulatory and respiratory systems: Secondary | ICD-10-CM | POA: Diagnosis not present

## 2019-06-11 DIAGNOSIS — R110 Nausea: Secondary | ICD-10-CM | POA: Diagnosis not present

## 2019-06-12 ENCOUNTER — Telehealth: Payer: Self-pay | Admitting: Cardiovascular Disease

## 2019-06-12 ENCOUNTER — Emergency Department (INDEPENDENT_AMBULATORY_CARE_PROVIDER_SITE_OTHER)
Admission: EM | Admit: 2019-06-12 | Discharge: 2019-06-12 | Disposition: A | Discharge status: Home / Self Care | Payer: BLUE CROSS/BLUE SHIELD | Source: Home / Self Care | Attending: Family Medicine | Admitting: Family Medicine

## 2019-06-12 ENCOUNTER — Other Ambulatory Visit: Payer: Self-pay

## 2019-06-12 DIAGNOSIS — Z711 Person with feared health complaint in whom no diagnosis is made: Secondary | ICD-10-CM

## 2019-06-12 DIAGNOSIS — R002 Palpitations: Secondary | ICD-10-CM | POA: Diagnosis not present

## 2019-06-12 DIAGNOSIS — Z8679 Personal history of other diseases of the circulatory system: Secondary | ICD-10-CM

## 2019-06-12 LAB — POCT CBC W AUTO DIFF (K'VILLE URGENT CARE)

## 2019-06-12 NOTE — Telephone Encounter (Signed)
The patient went to the ED this morning because he thought he was in afib. He was sent home and told to follow-up with Cardiology.  He is scheduled with Dr. Excell Seltzer 3/12. He understands he will be called if there are recommendations prior to that visit.

## 2019-06-12 NOTE — Telephone Encounter (Signed)
Patient c/o Palpitations:  High priority if patient c/o lightheadedness, shortness of breath, or chest pain  1) How long have you had palpitations/irregular HR/ Afib? Patient states Afib began last night, 06/12/19. Are you having the symptoms now? No  2) Are you currently experiencing lightheadedness, SOB or CP? SOB  3) Do you have a history of afib (atrial fibrillation) or irregular heart rhythm? Yes  4) Have you checked your BP or HR? (document readings if available):     BP: 120/90 (estimate)   HR: 90-110 (estimate)    5) Are you experiencing any other  symptoms? Patient states he felt a  "knot" in his throat.
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
## 2019-06-12 NOTE — ED Triage Notes (Signed)
Pt c/o fatigue after possible episode of afib last night. Pt takes flecainide prn but has not had to take it since 2015. Was checked out by EMS last night after taking a flecainide, was cleared and never went to hosp. Has call into cardiologist, waiting on return call. Appt with cardiologist scheduled for 12th. Past 2 days has felt "gasy", and experienced since palpitations yesterday as well as an intermittent "knot in throat" which led him to take the flecainide for first time in years.

## 2019-06-12 NOTE — Discharge Instructions (Addendum)
Begin clear liquids for 12 to 24 hours, then gradually advance diet.   If symptoms become significantly worse during the night or over the weekend, proceed to the local emergency room.

## 2019-06-19 ENCOUNTER — Other Ambulatory Visit: Payer: Self-pay

## 2019-06-19 ENCOUNTER — Ambulatory Visit (INDEPENDENT_AMBULATORY_CARE_PROVIDER_SITE_OTHER): Payer: BLUE CROSS/BLUE SHIELD | Admitting: Family Medicine

## 2019-06-19 ENCOUNTER — Encounter: Payer: Self-pay | Admitting: Family Medicine

## 2019-06-19 VITALS — BP 132/82 | HR 63 | Temp 98.1°F | Ht 73.0 in | Wt 234.0 lb

## 2019-06-19 DIAGNOSIS — R82998 Other abnormal findings in urine: Secondary | ICD-10-CM | POA: Insufficient documentation

## 2019-06-19 DIAGNOSIS — Z13220 Encounter for screening for lipoid disorders: Secondary | ICD-10-CM

## 2019-06-19 DIAGNOSIS — R8290 Unspecified abnormal findings in urine: Secondary | ICD-10-CM

## 2019-06-19 DIAGNOSIS — I480 Paroxysmal atrial fibrillation: Secondary | ICD-10-CM

## 2019-06-19 LAB — POCT URINALYSIS DIP (CLINITEK)
Bilirubin, UA: NEGATIVE
Glucose, UA: NEGATIVE mg/dL
Ketones, POC UA: NEGATIVE mg/dL
Leukocytes, UA: NEGATIVE
Nitrite, UA: NEGATIVE
POC PROTEIN,UA: NEGATIVE
Spec Grav, UA: >=1.03 — AB (ref 1.010–1.025)
Urobilinogen, UA: 0.2 E.U./dL
pH, UA: 7 (ref 5.0–8.0)

## 2019-06-19 NOTE — Assessment & Plan Note (Signed)
Recent symptoms consistent with episode of A. Fib.  Update labs today including CMP and TSH He has appt with cardiology next week.  Reviewed red flags including development of chest pain, significant fatigue or dizziness, shortness of breath, or worsening edema.

## 2019-06-19 NOTE — Assessment & Plan Note (Signed)
Urine is concentrated today with spec grav of >1.030.  No signs of UTI.  Doesn't really have any symptoms suggestive of kidney stone at this time either.  Instructed to really push fluid intake.  Update renal function.
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
## 2019-06-19 NOTE — Patient Instructions (Signed)
Great to meet you today! Lets update lab work to be sure that electrolytes (sodium, potassium, etc), thyroid function, etc are looking ok.  We'll also check a cholesterol panel to assess your risk for heart attack.  Keep appointment with cardiology.  Follow up with me in about 4-6 weeks for a physical.    Atrial Fibrillation  Atrial fibrillation is a type of irregular or rapid heartbeat (arrhythmia). In atrial fibrillation, the top part of the heart (atria) beats in an irregular pattern. This makes the heart unable to pump blood normally and effectively. The goal of treatment is to prevent blood clots from forming, control your heart rate, or restore your heartbeat to a normal rhythm. If this condition is not treated, it can cause serious problems, such as a weakened heart muscle (cardiomyopathy) or a stroke. What are the causes? This condition is often caused by medical conditions that damage the heart's electrical system. These include:  High blood pressure (hypertension). This is the most common cause.  Certain heart problems or conditions, such as heart failure, coronary artery disease, heart valve problems, or heart surgery.  Diabetes.  Overactive thyroid (hyperthyroidism).  Obesity.  Chronic kidney disease. In some cases, the cause of this condition is not known. What increases the risk? This condition is more likely to develop in:  Older people.  People who smoke.  Athletes who do endurance exercise.  People who have a family history of atrial fibrillation.  Men.  People who use drugs.  People who drink a lot of alcohol.  People who have lung conditions, such as emphysema, pneumonia, or COPD.  People who have obstructive sleep apnea. What are the signs or symptoms? Symptoms of this condition include:  A feeling that your heart is racing or beating irregularly.  Discomfort or pain in your chest.  Shortness of breath.  Sudden light-headedness or  weakness.  Tiring easily during exercise or activity.  Fatigue.  Syncope (fainting).  Sweating. In some cases, there are no symptoms. How is this diagnosed? Your health care provider may detect atrial fibrillation when taking your pulse. If detected, this condition may be diagnosed with:  An electrocardiogram (ECG) to check electrical signals of the heart.  An ambulatory cardiac monitor to record your heart's activity for a few days.  A transthoracic echocardiogram (TTE) to create pictures of your heart.  A transesophageal echocardiogram (TEE) to create even closer pictures of your heart.  A stress test to check your blood supply while you exercise.  Imaging tests, such as a CT scan or chest X-ray.  Blood tests. How is this treated? Treatment depends on underlying conditions and how you feel when you experience atrial fibrillation. This condition may be treated with:  Medicines to prevent blood clots or to treat heart rate or heart rhythm problems.  Electrical cardioversion to reset the heart's rhythm.  A pacemaker to correct abnormal heart rhythm.  Ablation to remove the heart tissue that sends abnormal signals.  Left atrial appendage closure to seal the area where blood clots can form. In some cases, underlying conditions will be treated. Follow these instructions at home: Medicines  Take over-the counter and prescription medicines only as told by your health care provider.  Do not take any new medicines without talking to your health care provider.  If you are taking blood thinners: ? Talk with your health care provider before you take any medicines that contain aspirin or NSAIDs, such as ibuprofen. These medicines increase your risk for dangerous bleeding. ?  Take your medicine exactly as told, at the same time every day. ? Avoid activities that could cause injury or bruising, and follow instructions about how to prevent falls. ? Wear a medical alert bracelet or  carry a card that lists what medicines you take. Lifestyle      Do not use any products that contain nicotine or tobacco, such as cigarettes, e-cigarettes, and chewing tobacco. If you need help quitting, ask your health care provider.  Eat heart-healthy foods. Talk with a dietitian to make an eating plan that is right for you.  Exercise regularly as told by your health care provider.  Do not drink alcohol.  Lose weight if you are overweight.  Do not use drugs, including cannabis. General instructions  If you have obstructive sleep apnea, manage your condition as told by your health care provider.  Do not use diet pills unless your health care provider approves. Diet pills can make heart problems worse.  Keep all follow-up visits as told by your health care provider. This is important. Contact a health care provider if you:  Notice a change in the rate, rhythm, or strength of your heartbeat.  Are taking a blood thinner and you notice more bruising.  Tire more easily when you exercise or do heavy work.  Have a sudden change in weight. Get help right away if you have:   Chest pain, abdominal pain, sweating, or weakness.  Trouble breathing.  Side effects of blood thinners, such as blood in your vomit, stool, or urine, or bleeding that cannot stop.  Any symptoms of a stroke. "BE FAST" is an easy way to remember the main warning signs of a stroke: ? B - Balance. Signs are dizziness, sudden trouble walking, or loss of balance. ? E - Eyes. Signs are trouble seeing or a sudden change in vision. ? F - Face. Signs are sudden weakness or numbness of the face, or the face or eyelid drooping on one side. ? A - Arms. Signs are weakness or numbness in an arm. This happens suddenly and usually on one side of the body. ? S - Speech. Signs are sudden trouble speaking, slurred speech, or trouble understanding what people say. ? T - Time. Time to call emergency services. Write down what  time symptoms started.  Other signs of a stroke, such as: ? A sudden, severe headache with no known cause. ? Nausea or vomiting. ? Seizure. These symptoms may represent a serious problem that is an emergency. Do not wait to see if the symptoms will go away. Get medical help right away. Call your local emergency services (911 in the [place] Do not drive yourself to the hospital. Summary  Atrial fibrillation is a type of irregular or rapid heartbeat (arrhythmia).  Symptoms include a feeling that your heart is beating fast or irregularly.  You may be given medicines to prevent blood clots or to treat heart rate or heart rhythm problems.  Get help right away if you have signs or symptoms of a stroke.  Get help right away if you cannot catch your breath or have chest pain or pressure. This information is not intended to replace advice given to you by your health care provider. Make sure you discuss any questions you have with your health care provider. Document Revised: 09/24/2018 Document Reviewed: 09/24/2018 Elsevier Patient Education  2020 [employer].

## 2019-06-20 LAB — COMPLETE METABOLIC PANEL WITH GFR
AG Ratio: 1.9 (calc) (ref 1.0–2.5)
ALT: 10 U/L (ref 9–46)
AST: 14 U/L (ref 10–40)
Albumin: 4.5 g/dL (ref 3.6–5.1)
Alkaline phosphatase (APISO): 38 U/L (ref 36–130)
BUN: 11 mg/dL (ref 7–25)
CO2: 26 mmol/L (ref 20–32)
Calcium: 9.6 mg/dL (ref 8.6–10.3)
Chloride: 104 mmol/L (ref 98–110)
Creat: 1.02 mg/dL (ref 0.60–1.35)
GFR, Est African American: 108 mL/min/{1.73_m2} (ref >=60)
GFR, Est Non African American: 93 mL/min/{1.73_m2} (ref >=60)
Globulin: 2.4 g/dL (calc) (ref 1.9–3.7)
Glucose, Bld: 95 mg/dL (ref 65–99)
Potassium: 4.7 mmol/L (ref 3.5–5.3)
Sodium: 138 mmol/L (ref 135–146)
Total Bilirubin: 0.6 mg/dL (ref 0.2–1.2)
Total Protein: 6.9 g/dL (ref 6.1–8.1)

## 2019-06-20 LAB — LIPID PANEL
Cholesterol: 123 mg/dL (ref <200)
HDL: 37 mg/dL — ABNORMAL LOW (ref >=40)
LDL Cholesterol (Calc): 72 mg/dL (calc)
Non-HDL Cholesterol (Calc): 86 mg/dL (calc) (ref <130)
Total CHOL/HDL Ratio: 3.3 (calc) (ref <5.0)
Triglycerides: 49 mg/dL (ref <150)

## 2019-06-20 LAB — CBC
HCT: 43.2 % (ref 38.5–50.0)
Hemoglobin: 15 g/dL (ref 13.2–17.1)
MCH: 30.3 pg (ref 27.0–33.0)
MCHC: 34.7 g/dL (ref 32.0–36.0)
MCV: 87.3 fL (ref 80.0–100.0)
MPV: 10.1 fL (ref 7.5–12.5)
Platelets: 400 10*3/uL (ref 140–400)
RBC: 4.95 10*6/uL (ref 4.20–5.80)
RDW: 12.4 % (ref 11.0–15.0)
WBC: 5.9 10*3/uL (ref 3.8–10.8)

## 2019-06-20 LAB — TSH: TSH: 1.1 mIU/L (ref 0.40–4.50)

## 2019-06-26 ENCOUNTER — Other Ambulatory Visit: Payer: Self-pay

## 2019-06-26 ENCOUNTER — Ambulatory Visit (INDEPENDENT_AMBULATORY_CARE_PROVIDER_SITE_OTHER): Payer: BLUE CROSS/BLUE SHIELD | Admitting: Cardiovascular Disease

## 2019-06-26 ENCOUNTER — Encounter: Payer: Self-pay | Admitting: Cardiovascular Disease

## 2019-06-26 VITALS — BP 132/76 | HR 83 | Ht 73.0 in | Wt 230.8 lb

## 2019-06-26 DIAGNOSIS — I480 Paroxysmal atrial fibrillation: Secondary | ICD-10-CM

## 2019-06-26 MED ORDER — FLECAINIDE ACETATE 150 MG PO TABS: 300.0000 mg | ORAL_TABLET | Freq: Every day | ORAL | 6 refills | Status: DC | PRN
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
## 2019-06-26 NOTE — Patient Instructions (Addendum)
Medication Instructions:  Your provider recommends that you continue on your current medications as directed. Please refer to the Current Medication list given to you today.  *If you need a refill on your cardiac medications before your next appointment, please call your pharmacy*  Testing/Procedures: Your provider has requested that you have an echocardiogram. Echocardiography is a painless test that uses sound waves to create images of your heart. It provides your doctor with information about the size and shape of your heart and how well your heart's chambers and valves are working. This procedure takes approximately one hour. There are no restrictions for this procedure.     Follow-Up: At [facility], you and your health needs are our priority.  As part of our continuing mission to provide you with exceptional heart care, we have created designated Provider Care Teams.  These Care Teams include your primary Cardiologist (physician) and Advanced Practice Providers (APPs -  Physician Assistants and Nurse Practitioners) who all work together to provide you with the care you need, when you need it. Your next appointment:   12 month(s) The format for your next appointment:   In Person Provider:   You may see Dr. Excell Seltzer or one of the following Advanced Practice Providers on your designated Care Team:    Tereso Newcomer, PA-C  [place], [place]

## 2019-07-10 ENCOUNTER — Other Ambulatory Visit: Payer: Self-pay

## 2019-07-10 ENCOUNTER — Ambulatory Visit (HOSPITAL_COMMUNITY): Payer: BLUE CROSS/BLUE SHIELD | Attending: Cardiology

## 2019-07-10 ENCOUNTER — Encounter: Payer: Self-pay | Admitting: Cardiovascular Disease

## 2019-07-10 DIAGNOSIS — I480 Paroxysmal atrial fibrillation: Secondary | ICD-10-CM | POA: Insufficient documentation

## 2019-07-31 ENCOUNTER — Encounter: Payer: BLUE CROSS/BLUE SHIELD | Admitting: Family Medicine

## 2020-05-20 DIAGNOSIS — Z20822 Contact with and (suspected) exposure to covid-19: Secondary | ICD-10-CM | POA: Diagnosis not present

## 2020-07-15 ENCOUNTER — Ambulatory Visit (INDEPENDENT_AMBULATORY_CARE_PROVIDER_SITE_OTHER): Payer: BC Managed Care – PPO | Admitting: Cardiovascular Disease

## 2020-07-15 ENCOUNTER — Other Ambulatory Visit: Payer: Self-pay

## 2020-07-15 ENCOUNTER — Encounter: Payer: Self-pay | Admitting: Cardiovascular Disease

## 2020-07-15 VITALS — BP 116/80 | HR 68 | Ht 73.0 in | Wt 250.2 lb

## 2020-07-15 DIAGNOSIS — I480 Paroxysmal atrial fibrillation: Secondary | ICD-10-CM | POA: Diagnosis not present

## 2020-07-15 MED ORDER — FLECAINIDE ACETATE 150 MG PO TABS: 300.0000 mg | ORAL_TABLET | Freq: Every day | ORAL | 6 refills | Status: DC | PRN
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
## 2020-07-15 NOTE — Patient Instructions (Signed)

## 2021-09-14 ENCOUNTER — Encounter: Payer: Self-pay | Admitting: Cardiovascular Disease

## 2021-09-14 ENCOUNTER — Ambulatory Visit (INDEPENDENT_AMBULATORY_CARE_PROVIDER_SITE_OTHER): Payer: BC Managed Care – PPO | Admitting: Cardiovascular Disease

## 2021-09-14 VITALS — BP 124/80 | HR 74 | Ht 73.0 in | Wt 246.8 lb

## 2021-09-14 DIAGNOSIS — I480 Paroxysmal atrial fibrillation: Secondary | ICD-10-CM

## 2021-09-14 MED ORDER — FLECAINIDE ACETATE 150 MG PO TABS: 300.0000 mg | ORAL_TABLET | Freq: Every day | ORAL | 3 refills | Status: DC | PRN

## 2021-09-14 NOTE — Patient Instructions (Signed)
Medication Instructions:  REFILL Flecanide *If you need a refill on your cardiac medications before your next appointment, please call your pharmacy*   Lab Work: NONE If you have labs (blood work) drawn today and your tests are completely normal, you will receive your results only by: [employer] (if you have MyChart) OR A paper copy in the mail If you have any lab test that is abnormal or we need to change your treatment, we will call you to review the results.   Testing/Procedures: NONE   Follow-Up: At [facility], you and your health needs are our priority.  As part of our continuing mission to provide you with exceptional heart care, we have created designated Provider Care Teams.  These Care Teams include your primary Cardiologist (physician) and Advanced Practice Providers (APPs -  Physician Assistants and Nurse Practitioners) who all work together to provide you with the care you need, when you need it.  We recommend signing up for the patient portal called "MyChart".  Sign up information is provided on this After Visit Summary.  MyChart is used to connect with patients for Virtual Visits (Telemedicine).  Patients are able to view lab/test results, encounter notes, upcoming appointments, etc.  Non-urgent messages can be sent to your provider as well.   To learn more about what you can do with MyChart, go to [email or web address].    Your next appointment:   1 year(s)  The format for your next appointment:   In Person  Provider:   Sherren Mocha, MD    Important Information About Sugar
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
## 2021-11-17 DIAGNOSIS — H5213 Myopia, bilateral: Secondary | ICD-10-CM | POA: Diagnosis not present

## 2022-12-13 ENCOUNTER — Encounter: Payer: Self-pay | Admitting: Cardiovascular Disease

## 2022-12-13 ENCOUNTER — Ambulatory Visit: Payer: BC Managed Care – PPO | Attending: Cardiovascular Disease | Admitting: Cardiovascular Disease

## 2022-12-13 VITALS — BP 126/80 | HR 70 | Ht 73.0 in | Wt 254.8 lb

## 2022-12-13 DIAGNOSIS — I480 Paroxysmal atrial fibrillation: Secondary | ICD-10-CM | POA: Diagnosis not present

## 2022-12-13 MED ORDER — FLECAINIDE ACETATE 150 MG PO TABS: 300.0000 mg | ORAL_TABLET | Freq: Every day | ORAL | 3 refills | Status: DC | PRN

## 2022-12-13 NOTE — Patient Instructions (Signed)
Medication Instructions:  Your physician recommends that you continue on your current medications as directed. Please refer to the Current Medication list given to you today.  *If you need a refill on your cardiac medications before your next appointment, please call your pharmacy*   Lab Work: NONE If you have labs (blood work) drawn today and your tests are completely normal, you will receive your results only by: MyChart Message (if you have MyChart) OR A paper copy in the mail If you have any lab test that is abnormal or we need to change your treatment, we will call you to review the results.   Testing/Procedures: July 2025- - Your physician has requested that you have an echocardiogram. Echocardiography is a painless test that uses sound waves to create images of your heart. It provides your doctor with information about the size and shape of your heart and how well your heart's chambers and valves are working. This procedure takes approximately one hour. There are no restrictions for this procedure. Please do NOT wear cologne, perfume, aftershave, or lotions (deodorant is allowed). Please arrive 15 minutes prior to your appointment time.    Follow-Up: At [facility], you and your health needs are our priority.  As part of our continuing mission to provide you with exceptional heart care, we have created designated Provider Care Teams.  These Care Teams include your primary Cardiologist (physician) and Advanced Practice Providers (APPs -  Physician Assistants and Nurse Practitioners) who all work together to provide you with the care you need, when you need it.   Your next appointment:   1 year(s)  Provider:   Tonny Bollman, MD
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
## 2022-12-22 ENCOUNTER — Other Ambulatory Visit: Payer: Self-pay | Admitting: Cardiovascular Disease

## 2023-09-25 DIAGNOSIS — S40862A Insect bite (nonvenomous) of left upper arm, initial encounter: Secondary | ICD-10-CM | POA: Diagnosis not present

## 2023-09-25 DIAGNOSIS — A779 Spotted fever, unspecified: Secondary | ICD-10-CM | POA: Diagnosis not present

## 2023-09-25 DIAGNOSIS — W57XXXA Bitten or stung by nonvenomous insect and other nonvenomous arthropods, initial encounter: Secondary | ICD-10-CM | POA: Diagnosis not present

## 2023-09-25 DIAGNOSIS — A6920 Lyme disease, unspecified: Secondary | ICD-10-CM | POA: Diagnosis not present

## 2023-10-25 ENCOUNTER — Ambulatory Visit (HOSPITAL_COMMUNITY)
Admission: RE | Admit: 2023-10-25 | Discharge: 2023-10-25 | Disposition: A | Discharge status: Home / Self Care | Source: Ambulatory Visit | Attending: Cardiovascular Disease | Admitting: Cardiovascular Disease

## 2023-10-25 ENCOUNTER — Ambulatory Visit: Payer: Self-pay | Admitting: Cardiovascular Disease

## 2023-10-25 DIAGNOSIS — I480 Paroxysmal atrial fibrillation: Secondary | ICD-10-CM | POA: Diagnosis not present

## 2023-10-25 LAB — ECHOCARDIOGRAM COMPLETE
Area-P 1/2: 2.84 cm2
S' Lateral: 3.2 cm

## 2024-01-10 ENCOUNTER — Ambulatory Visit: Admitting: Cardiovascular Disease
